# Patient Record
Sex: Female | Born: 1995 | Race: Black or African American | Hispanic: No | Marital: Single | Smoking: Never smoker
Health system: Southern US, Community
[De-identification: ages and names within clinical notes are randomized; demographics above are authoritative.]

## PROBLEM LIST (undated history)

## (undated) HISTORY — PX: APPENDECTOMY: SHX54

---
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
## 2014-02-13 ENCOUNTER — Other Ambulatory Visit (HOSPITAL_COMMUNITY)
Admission: RE | Admit: 2014-02-13 | Discharge: 2014-02-13 | Disposition: A | Discharge status: Home / Self Care | Payer: Medicaid Other | Source: Ambulatory Visit | Attending: Family Medicine | Admitting: Family Medicine

## 2014-02-13 ENCOUNTER — Emergency Department (INDEPENDENT_AMBULATORY_CARE_PROVIDER_SITE_OTHER)
Admission: EM | Admit: 2014-02-13 | Discharge: 2014-02-13 | Disposition: A | Discharge status: Home / Self Care | Payer: Medicaid Other | Source: Home / Self Care | Attending: Family Medicine | Admitting: Family Medicine

## 2014-02-13 ENCOUNTER — Encounter (HOSPITAL_COMMUNITY): Payer: Self-pay | Admitting: Emergency Medicine

## 2014-02-13 DIAGNOSIS — Z202 Contact with and (suspected) exposure to infections with a predominantly sexual mode of transmission: Secondary | ICD-10-CM

## 2014-02-13 DIAGNOSIS — Z711 Person with feared health complaint in whom no diagnosis is made: Secondary | ICD-10-CM

## 2014-02-13 DIAGNOSIS — Z113 Encounter for screening for infections with a predominantly sexual mode of transmission: Secondary | ICD-10-CM | POA: Insufficient documentation

## 2014-02-13 DIAGNOSIS — N760 Acute vaginitis: Secondary | ICD-10-CM | POA: Insufficient documentation

## 2014-02-13 MED ORDER — AZITHROMYCIN 250 MG PO TABS
1000.0000 mg | ORAL_TABLET | Freq: Once | ORAL | Status: AC
  Administered 2014-02-13: 1000 mg via ORAL

## 2014-02-13 MED ORDER — CEFTRIAXONE SODIUM 1 G IJ SOLR
250.0000 mg | Freq: Once | INTRAMUSCULAR | Status: AC
  Administered 2014-02-13: 250 mg via INTRAMUSCULAR

## 2014-02-13 MED ORDER — AZITHROMYCIN 250 MG PO TABS
ORAL_TABLET | ORAL | Status: AC
  Filled 2014-02-13: qty 4

## 2014-02-13 MED ORDER — CEFTRIAXONE SODIUM 250 MG IJ SOLR
INTRAMUSCULAR | Status: AC
  Filled 2014-02-13: qty 250

## 2014-02-13 MED ORDER — LIDOCAINE HCL (PF) 1 % IJ SOLN
INTRAMUSCULAR | Status: AC
  Filled 2014-02-13: qty 5

## 2014-02-13 NOTE — ED Provider Notes (Signed)
CSN: [id number]     Arrival date & time 02/13/14  1254 History   First MD Initiated Contact with Patient 02/13/14 1304     Chief Complaint  Patient presents with  . Exposure to STD   (Consider location/radiation/quality/duration/timing/severity/associated sxs/prior Treatment) Patient is a 18 y.o. female presenting with STD exposure. The history is provided by the patient.  Exposure to STD This is a new problem. The current episode started more than 1 week ago (tested neg on 9/14 for std, but after had sex with partner who now has sx, pt with no sx.). The problem has not changed since onset.Pertinent negatives include no abdominal pain.    History reviewed. No pertinent past medical history. Past Surgical History  Procedure Laterality Date  . Appendectomy     History reviewed. No pertinent family history. History  Substance Use Topics  . Smoking status: Never Smoker   . Smokeless tobacco: Not on file  . Alcohol Use: No   OB History   Grav Para Term Preterm Abortions TAB SAB Ect Mult Living                 Review of Systems  Constitutional: Negative.   Gastrointestinal: Negative.  Negative for abdominal pain.  Genitourinary: Negative.  Negative for dysuria, urgency, frequency, vaginal discharge and genital sores.    Allergies  Review of patient's allergies indicates no known allergies.  Home Medications   Prior to Admission medications   Medication Sig Start Date End Date Taking? Authorizing Provider  fluconazole (DIFLUCAN) 150 MG tablet Take 1 tablet (150 mg total) by mouth daily. Repeat dose in 3 days 07/05/13   Ozella Rocksavid J Merrell, MD  norgestimate-ethinyl estradiol (ORTHO-CYCLEN,SPRINTEC,PREVIFEM) 0.25-35 MG-MCG tablet Take 1 tablet by mouth daily.    Historical Provider, MD   BP 117/65  Pulse 66  Temp(Src) 98.7 F (37.1 C) (Oral)  Resp 16  SpO2 100%  LMP 02/08/2014 Physical Exam  Nursing note and vitals reviewed. Constitutional: She is oriented to person, place,  and time. She appears well-developed and well-nourished.  Neck: Normal range of motion. Neck supple.  Abdominal: Soft. Bowel sounds are normal. She exhibits no mass. There is no tenderness. There is no rebound and no guarding.  Lymphadenopathy:    She has no cervical adenopathy.  Neurological: She is alert and oriented to person, place, and time.  Skin: Skin is warm and dry.    ED Course  Procedures (including critical care time) Labs Review Labs Reviewed  URINE CYTOLOGY ANCILLARY ONLY    Imaging Review No results found.   MDM   1. Concern about STD in female without diagnosis        Linna HoffJames D Grey Schlauch, MD 02/13/14 1334

## 2014-02-13 NOTE — ED Notes (Signed)
Pt came in today because she said that she would like to be treated for an STD, she said that she had a negative STD test on 9/14 and then had sex the next day with someone who is symptomatic for an STD, pt say that she does not have any symptoms but would still like to be treated

## 2014-02-13 NOTE — Discharge Instructions (Signed)
We will call with positive test results and treat as indicatedWe will call with test results and treat as indicated

## 2014-02-14 LAB — URINE CYTOLOGY ANCILLARY ONLY
Chlamydia: NEGATIVE
Neisseria Gonorrhea: NEGATIVE
Trichomonas: NEGATIVE

## 2014-02-16 LAB — URINE CYTOLOGY ANCILLARY ONLY: Bacterial vaginitis: NEGATIVE

## 2014-02-21 ENCOUNTER — Emergency Department (HOSPITAL_COMMUNITY)
Admission: EM | Admit: 2014-02-21 | Discharge: 2014-02-22 | Disposition: A | Discharge status: Home / Self Care | Payer: Medicaid Other | Attending: Emergency Medicine | Admitting: Emergency Medicine

## 2014-02-21 ENCOUNTER — Encounter (HOSPITAL_COMMUNITY): Payer: Self-pay | Admitting: Emergency Medicine

## 2014-02-21 DIAGNOSIS — Z3202 Encounter for pregnancy test, result negative: Secondary | ICD-10-CM | POA: Insufficient documentation

## 2014-02-21 DIAGNOSIS — N898 Other specified noninflammatory disorders of vagina: Secondary | ICD-10-CM | POA: Diagnosis present

## 2014-02-21 DIAGNOSIS — Z79899 Other long term (current) drug therapy: Secondary | ICD-10-CM | POA: Insufficient documentation

## 2014-02-21 LAB — POC URINE PREG, ED: PREG TEST UR: NEGATIVE

## 2014-02-21 LAB — URINALYSIS, ROUTINE W REFLEX MICROSCOPIC
BILIRUBIN URINE: NEGATIVE
Glucose, UA: NEGATIVE mg/dL
HGB URINE DIPSTICK: NEGATIVE
Ketones, ur: 15 mg/dL — AB
Leukocytes, UA: NEGATIVE
NITRITE: NEGATIVE
PH: 6 (ref 5.0–8.0)
Protein, ur: NEGATIVE mg/dL
Specific Gravity, Urine: 1.034 — ABNORMAL HIGH (ref 1.005–1.030)
Urobilinogen, UA: 1 mg/dL (ref 0.0–1.0)

## 2014-02-21 LAB — WET PREP, GENITAL
TRICH WET PREP: NONE SEEN
Yeast Wet Prep HPF POC: NONE SEEN

## 2014-02-21 MED ORDER — HYDROCORTISONE 1 % EX CREA
TOPICAL_CREAM | Freq: Once | CUTANEOUS | Status: AC
  Administered 2014-02-22: 1 via TOPICAL
  Filled 2014-02-21: qty 28

## 2014-02-21 NOTE — ED Notes (Signed)
Pt sts she thinks she has a yeast infection. Pt reports chunky B[place]cker discharge intermittently since sept 14. Has been treated for it before but last week she received antibiotics for STD which cause her to get yeast infections. Pt also reports vaginal pain now.

## 2014-02-21 NOTE — ED Notes (Signed)
PA at bedside.

## 2014-02-21 NOTE — Discharge Instructions (Signed)
PLEASE DO NOT USE THE HYDROCORTISONE FOR MORE THAN A COUPLE OF DAYS YOU NEED TO SEE THE GYNECOLOGIST FOR FURTHER EVALUATION OF YOUR ONGOING SYMPTOMS PLEASE REFRAIN FROM SEX UNTIL YOUR CULTURE RESULTS COME BACK, YOU CAN F[facility] THE [facility]

## 2014-02-21 NOTE — ED Provider Notes (Signed)
CSN: [id number]     Arrival date & time 02/21/14  2017 History   First MD Initiated Contact with Patient 02/21/14 2116     Chief Complaint  Patient presents with  . Vaginal Discharge     (Consider location/radiation/quality/duration/timing/severity/associated sxs/prior Treatment) HPI  Patient to the ER for evaluation of vaginal discharge. Was recently treated for possible STD, then developed yeast infection and was treated with antifungals. She is now complaining about discharge again with foul odor. She is not having any pain. She brought her boyfriend to be tested at the same time for concern that they may be giving it back and forth. She otherwise feels fine;  History reviewed. No pertinent past medical history. Past Surgical History  Procedure Laterality Date  . Appendectomy     No family history on file. History  Substance Use Topics  . Smoking status: Never Smoker   . Smokeless tobacco: Not on file  . Alcohol Use: No   OB History   Grav Para Term Preterm Abortions TAB SAB Ect Mult Living                 Review of Systems   Review of Systems  Gen: no weight loss, fevers, chills, night sweats  Eyes: no occular draining, occular pain,  No visual changes  Nose: no epistaxis or rhinorrhea  Mouth: no dental pain, no sore throat  Neck: no neck pain  Lungs: No hemoptysis. No wheezing or coughing CV:  No palpitations, dependent edema or orthopnea. No chest pain Abd: no diarrhea. No nausea or vomiting, No abdominal pain  GU: no dysuria or gross hematuria + vaginal discharge and odor MSK:  No muscle weakness, No muscular pain Neuro: no headache, no focal neurologic deficits  Skin: no rash , no wounds Psyche: no complaints of depression or anxiety    Allergies  Review of patient's allergies indicates no known allergies.  Home Medications   Prior to Admission medications   Medication Sig Start Date End Date Taking? Authorizing Provider  norgestimate-ethinyl  estradiol (ORTHO-CYCLEN,SPRINTEC,PREVIFEM) 0.25-35 MG-MCG tablet Take 1 tablet by mouth daily.   Yes Historical Provider, MD   BP 104/72  Pulse 65  Temp(Src) 98.2 F (36.8 C) (Oral)  Resp 18  Ht 5\' 6"  (1.676 m)  Wt 130 lb (58.968 kg)  BMI 20.99 kg/m2  SpO2 100%  LMP 02/08/2014 Physical Exam  Nursing note and vitals reviewed. Constitutional: She appears well-developed and well-nourished. No distress.  HENT:  Head: Normocephalic and atraumatic.  Eyes: Pupils are equal, round, and reactive to light.  Neck: Normal range of motion. Neck supple.  Cardiovascular: Normal rate and regular rhythm.   Pulmonary/Chest: Effort normal.  Abdominal: Soft.  Genitourinary: Uterus normal. Cervix exhibits no motion tenderness, no discharge and no friability. Right adnexum displays no mass, no tenderness and no fullness. Left adnexum displays no mass, no tenderness and no fullness. No tenderness or bleeding around the vagina. No foreign body around the vagina. Vaginal discharge (whtie discharge noted in vaginal canal) found.  Neurological: She is alert.  Skin: Skin is warm and dry.    ED Course  Procedures (including critical care time) Labs Review Labs Reviewed  WET PREP, GENITAL - Abnormal; Notable for the following:    Clue Cells Wet Prep HPF POC FEW (*)    WBC, Wet Prep HPF POC MODERATE (*)    All other components within normal limits  URINALYSIS, ROUTINE W REFLEX MICROSCOPIC - Abnormal; Notable for the following:  Color, Urine AMBER (*)    Specific Gravity, Urine 1.034 (*)    Ketones, ur 15 (*)    All other components within normal limits  GC/CHLAMYDIA PROBE AMP  RPR  HIV ANTIBODY (ROUTINE TESTING)  POC URINE PREG, ED    Imaging Review No results found.   EKG Interpretation None      MDM   Final diagnoses:  Vaginal discharge    Gc, chlamydia, RPR and HIV pending. Pt elects not to be treated again at this time due to recently being treated. Recommended she see a  gynecologist for further eval/work-up.  18 y.o.Monic Prowell's evaluation in the Emergency Department is complete. It has been determined that no acute conditions requiring further emergency intervention are present at this time. The patient/guardian have been advised of the diagnosis and plan. We have discussed signs and symptoms that warrant return to the ED, such as changes or worsening in symptoms.  Vital signs are stable at discharge. Filed Vitals:   02/21/14 2358  BP: 104/72  Pulse: 65  Temp: 98.2 F (36.8 C)  Resp: 18    Patient/guardian has voiced understanding and agreed to follow-up with the PCP or specialist.     Dorthula Matas, PA-C 02/24/14 1418

## 2014-02-22 LAB — HIV ANTIBODY (ROUTINE TESTING W REFLEX): HIV 1&2 Ab, 4th Generation: NONREACTIVE

## 2014-02-22 LAB — RPR

## 2014-02-23 ENCOUNTER — Encounter: Payer: Medicaid Other | Admitting: Obstetrics & Gynecology

## 2014-02-23 LAB — GC/CHLAMYDIA PROBE AMP
CT Probe RNA: NEGATIVE
GC PROBE AMP APTIMA: NEGATIVE

## 2014-02-27 NOTE — ED Provider Notes (Signed)
  Medical screening examination/treatment/procedure(s) were performed by non-physician practitioner and as supervising physician I was immediately available for consultation/collaboration.    Gerhard Munchobert Tamatha Gadbois, MD 02/27/14 [phone]

## 2014-05-30 ENCOUNTER — Emergency Department (INDEPENDENT_AMBULATORY_CARE_PROVIDER_SITE_OTHER)
Admission: EM | Admit: 2014-05-30 | Discharge: 2014-05-30 | Disposition: A | Discharge status: Home / Self Care | Payer: Medicaid Other | Source: Home / Self Care | Attending: Emergency Medicine | Admitting: Emergency Medicine

## 2014-05-30 ENCOUNTER — Other Ambulatory Visit (HOSPITAL_COMMUNITY)
Admission: RE | Admit: 2014-05-30 | Discharge: 2014-05-30 | Disposition: A | Discharge status: Home / Self Care | Payer: Medicaid Other | Source: Ambulatory Visit | Attending: Emergency Medicine | Admitting: Emergency Medicine

## 2014-05-30 ENCOUNTER — Encounter (HOSPITAL_COMMUNITY): Payer: Self-pay | Admitting: *Deleted

## 2014-05-30 DIAGNOSIS — Z113 Encounter for screening for infections with a predominantly sexual mode of transmission: Secondary | ICD-10-CM | POA: Insufficient documentation

## 2014-05-30 DIAGNOSIS — N760 Acute vaginitis: Secondary | ICD-10-CM

## 2014-05-30 LAB — POCT URINALYSIS DIP (DEVICE)
Bilirubin Urine: NEGATIVE
Glucose, UA: NEGATIVE mg/dL
Hgb urine dipstick: NEGATIVE
Ketones, ur: NEGATIVE mg/dL
Leukocytes, UA: NEGATIVE
NITRITE: NEGATIVE
PROTEIN: NEGATIVE mg/dL
SPECIFIC GRAVITY, URINE: 1.025 (ref 1.005–1.030)
UROBILINOGEN UA: 0.2 mg/dL (ref 0.0–1.0)
pH: 7 (ref 5.0–8.0)

## 2014-05-30 LAB — POCT PREGNANCY, URINE: Preg Test, Ur: NEGATIVE

## 2014-05-30 MED ORDER — FLUCONAZOLE 150 MG PO TABS: 150.0000 mg | ORAL_TABLET | Freq: Once | ORAL | Status: DC

## 2014-05-30 NOTE — ED Provider Notes (Addendum)
CSN: [id number]     Arrival date & time 05/30/14  1331 History   First MD Initiated Contact with Patient 05/30/14 1346     Chief Complaint  Patient presents with  . Vaginal Discharge   (Consider location/radiation/quality/duration/timing/severity/associated sxs/prior Treatment) HPI        19 year old female presents for evaluation of vaginal discharge and vaginal itching. Symptoms have been worsening for the past 3 days. She denies any odor. She did have some dysuria and lower abdominal discomfort but that has resolved. She denies any risk for STDs. She has a history of frequent yeast infections, she thinks she has that again.   History reviewed. No pertinent past medical history. Past Surgical History  Procedure Laterality Date  . Appendectomy     History reviewed. No pertinent family history. History  Substance Use Topics  . Smoking status: Never Smoker   . Smokeless tobacco: Not on file  . Alcohol Use: No   OB History    No data available     Review of Systems  Constitutional: Negative for fever and chills.  Genitourinary: Positive for vaginal discharge. Negative for vaginal bleeding, genital sores, vaginal pain and pelvic pain.  All other systems reviewed and are negative.   Allergies  Doxycycline  Home Medications   Prior to Admission medications   Medication Sig Start Date End Date Taking? Authorizing Provider  fluconazole (DIFLUCAN) 150 MG tablet Take 1 tablet (150 mg total) by mouth once. Pick up the refill and and take second dose in 5 days if symptoms have not resolved 05/30/14   Graylon Good, PA-C  norgestimate-ethinyl estradiol (ORTHO-CYCLEN,SPRINTEC,PREVIFEM) 0.25-35 MG-MCG tablet Take 1 tablet by mouth daily.    Historical Provider, MD   BP 127/76 mmHg  Pulse 66  Temp(Src) 98.1 F (36.7 C) (Oral)  Resp 16  SpO2 100%  LMP 05/17/2014 Physical Exam  Constitutional: She is oriented to person, place, and time. Vital signs are normal. She appears  well-developed and well-nourished. No distress.  HENT:  Head: Normocephalic and atraumatic.  Pulmonary/Chest: Effort normal. No respiratory distress.  Abdominal: Soft. Bowel sounds are normal. She exhibits no distension and no mass. There is no tenderness. There is no rebound, no guarding and no CVA tenderness.  Genitourinary: There is no lesion on the right labia. There is no lesion on the left labia. Cervix exhibits no discharge and no friability. Right adnexum displays no mass, no tenderness and no fullness. Left adnexum displays no mass and no fullness. Vaginal discharge (Weinert, clumpy ) found.  Lymphadenopathy:       Right: No inguinal adenopathy present.       Left: No inguinal adenopathy present.  Neurological: She is alert and oriented to person, place, and time. She has normal strength. Coordination normal.  Skin: Skin is warm and dry. No rash noted. She is not diaphoretic.  Psychiatric: She has a normal mood and affect. Judgment normal.  Nursing note and vitals reviewed.   ED Course  Procedures (including critical care time) Labs Review Labs Reviewed  POCT PREGNANCY, URINE  POCT URINALYSIS DIP (DEVICE)  CERVICOVAGINAL ANCILLARY ONLY    Imaging Review No results found.   MDM   1. Vaginitis    Treat with Diflucan. Follow-up when necessary   Meds ordered this encounter  Medications  . fluconazole (DIFLUCAN) 150 MG tablet    Sig: Take 1 tablet (150 mg total) by mouth once. Pick up the refill and and take second dose in 5 days if symptoms have  not resolved    Dispense:  1 tablet    Refill:  1    Order Specific Question:  Supervising Provider    Answer:  Lorenz CoasterKELLER, DAVID C [[id number]]       Graylon GoodZachary H Fenna Semel, PA-C 05/30/14 1424      Positive for candida and chlamydia.  Sent in Rx for azithromycin.    Graylon GoodZachary H Guido Comp, PA-C 06/02/14 1031

## 2014-05-30 NOTE — ED Notes (Signed)
Pt  Reports     A  Vaginal  Discharge     X  3  Days       Pt      denys  Any  Sores  Or  Any         Bleeding  She  Ambulated  To  Room  With a  Steady  Fluid  Gait

## 2014-05-30 NOTE — Discharge Instructions (Signed)

## 2014-05-31 LAB — CERVICOVAGINAL ANCILLARY ONLY
Chlamydia: POSITIVE — AB
Neisseria Gonorrhea: NEGATIVE

## 2014-06-01 LAB — CERVICOVAGINAL ANCILLARY ONLY
WET PREP (BD AFFIRM): NEGATIVE
WET PREP (BD AFFIRM): NEGATIVE
WET PREP (BD AFFIRM): POSITIVE — AB

## 2014-06-02 ENCOUNTER — Telehealth (HOSPITAL_COMMUNITY): Payer: Self-pay | Admitting: *Deleted

## 2014-06-02 MED ORDER — AZITHROMYCIN 500 MG PO TABS: 1000.0000 mg | ORAL_TABLET | Freq: Once | ORAL | Status: DC

## 2014-06-02 NOTE — ED Notes (Signed)
GC neg., Chlamydia pos., Affirm: Candida pos., Gardnerella and Trich neg.  Pt. adequately treated with Diflucan. Discussed with Ian MalkinZach PA and he e-prescribed Zithromax to [facility] [address]. I called pt.  Pt. verified x 2 and given results.  Pt. told she was adequately treated with Diflucan for yeast infection and needs Zithromax for Chlamydia.  Pt. told where to pick up her Rx.  Pt. instructed to notify her partner, no sex for 1 week and to practice safe sex. Pt. [facility] by [facility].  [facility]. Vassie MoselleYork, Jonette Wassel M 06/02/2014

## 2015-10-18 ENCOUNTER — Ambulatory Visit (HOSPITAL_COMMUNITY)
Admission: EM | Admit: 2015-10-18 | Discharge: 2015-10-18 | Disposition: A | Discharge status: Home / Self Care | Payer: Medicaid Other | Attending: Emergency Medicine | Admitting: Emergency Medicine

## 2015-10-18 ENCOUNTER — Encounter (HOSPITAL_COMMUNITY): Payer: Self-pay | Admitting: *Deleted

## 2015-10-18 DIAGNOSIS — B373 Candidiasis of vulva and vagina: Secondary | ICD-10-CM

## 2015-10-18 DIAGNOSIS — B3731 Acute candidiasis of vulva and vagina: Secondary | ICD-10-CM

## 2015-10-18 DIAGNOSIS — L298 Other pruritus: Secondary | ICD-10-CM | POA: Diagnosis present

## 2015-10-18 LAB — POCT URINALYSIS DIP (DEVICE)
Bilirubin Urine: NEGATIVE
GLUCOSE, UA: NEGATIVE mg/dL
Hgb urine dipstick: NEGATIVE
Ketones, ur: NEGATIVE mg/dL
LEUKOCYTES UA: NEGATIVE
Nitrite: NEGATIVE
PROTEIN: 30 mg/dL — AB
SPECIFIC GRAVITY, URINE: 1.025 (ref 1.005–1.030)
UROBILINOGEN UA: 1 mg/dL (ref 0.0–1.0)
pH: 6.5 (ref 5.0–8.0)

## 2015-10-18 LAB — POCT PREGNANCY, URINE: PREG TEST UR: NEGATIVE

## 2015-10-18 MED ORDER — FLUCONAZOLE 200 MG PO TABS: 200.0000 mg | ORAL_TABLET | Freq: Every day | ORAL | Status: AC

## 2015-10-18 NOTE — ED Provider Notes (Signed)
CSN: [id number]     Arrival date & time 10/18/15  1358 History   First MD Initiated Contact with Patient 10/18/15 1514     Chief Complaint  Patient presents with  . Vaginal Itching   (Consider location/radiation/quality/duration/timing/severity/associated sxs/prior Treatment) HPI History obtained from patient:  Pt presents with the cc of:  Vaginal irritation, discharge Duration of symptoms: 1 week Treatment prior to arrival: none Context: used condoms, noticed irritation of vagina. Other symptoms include: Codner discharge without odor Pain score: 0 FAMILY HISTORY: no history of cancer    History reviewed. No pertinent past medical history. Past Surgical History  Procedure Laterality Date  . Appendectomy     History reviewed. No pertinent family history. Social History  Substance Use Topics  . Smoking status: Never Smoker   . Smokeless tobacco: None  . Alcohol Use: No   OB History    No data available     Review of Systems  Denies: HEADACHE, NAUSEA, ABDOMINAL PAIN, CHEST PAIN, CONGESTION, DYSURIA, SHORTNESS OF BREATH  Allergies  Doxycycline  Home Medications   Prior to Admission medications   Medication Sig Start Date End Date Taking? Authorizing Provider  azithromycin (ZITHROMAX) 500 MG tablet Take 2 tablets (1,000 mg total) by mouth once. 06/02/14   Graylon GoodZachary H Baker, PA-C  fluconazole (DIFLUCAN) 150 MG tablet Take 1 tablet (150 mg total) by mouth once. Pick up the refill and and take second dose in 5 days if symptoms have not resolved 05/30/14   Graylon GoodZachary H Baker, PA-C  fluconazole (DIFLUCAN) 200 MG tablet Take 1 tablet (200 mg total) by mouth daily. 10/18/15 10/25/15  Tharon AquasFrank C Liam Bossman, PA  norgestimate-ethinyl estradiol (ORTHO-CYCLEN,SPRINTEC,PREVIFEM) 0.25-35 MG-MCG tablet Take 1 tablet by mouth daily.    Historical Provider, MD   Meds Ordered and Administered this Visit  Medications - No data to display  BP 116/76 mmHg  Pulse 78  Temp(Src) 98 F (36.7 C) (Oral)  Resp  14  SpO2 100%  LMP 10/02/2015 No data found.   Physical Exam NURSES NOTES AND VITAL SIGNS REVIEWED. CONSTITUTIONAL: Well developed, well nourished, no acute distress HEENT: normocephalic, atraumatic EYES: Conjunctiva normal NECK:normal ROM, supple, no adenopathy PULMONARY:No respiratory distress, normal effort ABDOMINAL: Soft, ND, NT BS+, No CVAT MUSCULOSKELETAL: Normal ROM of all extremities,  SKIN: warm and dry without rash PSYCHIATRIC: Mood and affect, behavior are normal NURSES NOTES AND VITAL SIGNS REVIEWED. CONSTITUTIONAL: Well developed, well nourished, no acute distress HEENT: normocephalic, atraumatic, right and left TM's are normal EYES: Conjunctiva normal NECK:normal ROM, supple, no adenopathy PULMONARY:No respiratory distress, normal effort Exam is performed with patient's permission Female nursing staff present to chaperone.  Perineum: clean, dry without lesions, no groin or inguinal Lymphadenopathy; urethra . No caruncle or prolapse noted.  Vaginal canal: moderate amount thick Gao adherent discharge noted in canal with loss of rugae. Scant amount thin homogenous Sarracino-yellow discharge in fornix.  Cervix is pink and non-friable.    Bi-manual: no pain on palpation of cervix or adnexal structures, ovaries not enlarged.   ED Course  Procedures (including critical care time)  Labs Review Labs Reviewed  POCT URINALYSIS DIP (DEVICE) - Abnormal; Notable for the following:    Protein, ur 30 (*)    All other components within normal limits  POCT PREGNANCY, URINE  CERVICOVAGINAL ANCILLARY ONLY   Culture pending Imaging Review No results found.   Visual Acuity Review  Right Eye Distance:   Left Eye Distance:   Bilateral Distance:    Right Eye Near:  Left Eye Near:    Bilateral Near:         MDM   1. Vaginal yeast infection     Patient is reassured that there are no issues that require transfer to higher level of care at this time or additional  tests. Patient is advised to continue home symptomatic treatment. Patient is advised that if there are new or worsening symptoms to attend the emergency department, contact primary care provider, or return to UC. Instructions of care provided discharged home in stable condition.    THIS NOTE WAS GENERATED USING A VOICE RECOGNITION SOFTWARE PROGRAM. ALL REASONABLE EFFORTS  WERE MADE TO PROOFREAD THIS DOCUMENT FOR ACCURACY.  I have verbally reviewed the discharge instructions with the patient. A printed AVS was given to the patient.  All questions were answered prior to discharge.      Tharon Aquas, PA 10/18/15 1626

## 2015-10-18 NOTE — ED Notes (Signed)
Pt  Reports  Signs  Of vaginal  Irritation   Burning  -   With   Clumpy   Crance  Vaginal  Discharge  With   Symptoms  X  2  Weeks        Pt    Reports     Irritated      Sensation

## 2015-10-18 NOTE — Discharge Instructions (Signed)
Vaginitis °Vaginitis is an inflammation of the vagina. It can happen when the normal bacteria and yeast in the vagina grow too much. There are different types. Treatment will depend on the type you have. °HOME CARE °· Take all medicines as told by your doctor. °· Keep your vagina area clean and dry. Avoid soap. Rinse the area with water. °· Avoid washing and cleaning out the vagina (douching). °· Do not use tampons or have sex (intercourse) until your treatment is done. °· Wipe from front to back after going to the restroom. °· Wear cotton underwear. °· Avoid wearing underwear while you sleep until your vaginitis is gone. °· Avoid tight pants. Avoid underwear or nylons without a cotton panel. °· Take off wet clothing (such as a bathing suit) as soon as you can. °· Use mild, unscented products. Avoid fabric softeners and scented: °· Feminine sprays. °· Laundry detergents. °· Tampons. °· Soaps or bubble baths. °· Practice safe sex and use condoms. °GET HELP RIGHT AWAY IF:  °· You have belly (abdominal) pain. °· You have a fever or lasting symptoms for more than 2-3 days. °· You have a fever and your symptoms suddenly get worse. °MAKE SURE YOU:  °· Understand these instructions. °· Will watch this condition. °· Will get help right away if you are not doing well or get worse. °  °This information is not intended to replace advice given to you by your health care provider. Make sure you discuss any questions you have with your health care provider. °  °Document Released: 07/31/2008 Document Revised: 01/27/2012 Document Reviewed: 10/15/2011 °Elsevier Interactive Patient Education ©2016 [employer] ° °Monilial Vaginitis °Vaginitis in a soreness, swelling and redness (inflammation) of the vagina and vulva. Monilial vaginitis is not a sexually transmitted infection. °CAUSES  °Yeast vaginitis is caused by yeast (candida) that is normally found in your vagina. With a yeast infection, the candida has overgrown in number to a  point that upsets the chemical balance. °SYMPTOMS  °· [place], thick vaginal discharge. °· Swelling, itching, redness and irritation of the vagina and possibly the lips of the vagina (vulva). °· Burning or painful urination. °· Painful intercourse. °DIAGNOSIS  °Things that may contribute to monilial vaginitis are: °· Postmenopausal and virginal states. °· Pregnancy. °· Infections. °· Being tired, sick or stressed, especially if you had monilial vaginitis in the past. °· Diabetes. Good control will help lower the chance. °· Birth control pills. °· Tight fitting garments. °· Using bubble bath, feminine sprays, douches or deodorant tampons. °· Taking certain medications that kill germs (antibiotics). °· Sporadic recurrence can occur if you become ill. °TREATMENT  °Your caregiver will give you medication. °· There are several kinds of anti monilial vaginal creams and suppositories specific for monilial vaginitis. For recurrent yeast infections, use a suppository or cream in the vagina 2 times a week, or as directed. °· Anti-monilial or steroid cream for the itching or irritation of the vulva may also be used. Get your caregiver's permission. °· Painting the vagina with methylene blue solution may help if the monilial cream does not work. °· Eating yogurt may help prevent monilial vaginitis. °HOME CARE INSTRUCTIONS  °· Finish all medication as prescribed. °· Do not have sex until treatment is completed or after your caregiver tells you it is okay. °· Take warm sitz baths. °· Do not douche. °· Do not use tampons, especially scented ones. °· Wear cotton underwear. °· Avoid tight pants and panty hose. °· Tell your sexual partner   that you have a yeast infection. They should go to their caregiver if they have symptoms such as mild rash or itching. °· Your sexual partner should be treated as well if your infection is difficult to eliminate. °· Practice safer sex. Use condoms. °· Some vaginal medications cause latex condoms to  fail. Vaginal medications that harm condoms are: °¨ Cleocin cream. °¨ Butoconazole (Femstat®). °¨ Terconazole (Terazol®) vaginal suppository. °¨ Miconazole (Monistat®) (may be purchased over the counter). °SEEK MEDICAL CARE IF:  °· You have a temperature by mouth above 102° F (38.9° C). °· The infection is getting worse after 2 days of treatment. °· The infection is not getting better after 3 days of treatment. °· You develop blisters in or around your vagina. °· You develop vaginal bleeding, and it is not your menstrual period. °· You have pain when you urinate. °· You develop intestinal problems. °· You have pain with sexual intercourse. °  °This information is not intended to replace advice given to you by your health care provider. Make sure you discuss any questions you have with your health care provider. °  °Document Released: 02/11/2005 Document Revised: 07/27/2011 Document Reviewed: 11/05/2014 °Elsevier Interactive Patient Education ©2016 Elsevier Inc. ° °

## 2015-10-21 LAB — CERVICOVAGINAL ANCILLARY ONLY
Chlamydia: NEGATIVE
Neisseria Gonorrhea: NEGATIVE

## 2015-10-22 LAB — CERVICOVAGINAL ANCILLARY ONLY: Wet Prep (BD Affirm): NEGATIVE

## 2015-12-21 ENCOUNTER — Encounter (HOSPITAL_COMMUNITY): Payer: Self-pay | Admitting: *Deleted

## 2015-12-21 ENCOUNTER — Ambulatory Visit (HOSPITAL_COMMUNITY)
Admission: EM | Admit: 2015-12-21 | Discharge: 2015-12-21 | Disposition: A | Discharge status: Home / Self Care | Payer: Medicaid Other | Attending: Emergency Medicine | Admitting: Emergency Medicine

## 2015-12-21 DIAGNOSIS — B3731 Acute candidiasis of vulva and vagina: Secondary | ICD-10-CM

## 2015-12-21 DIAGNOSIS — B373 Candidiasis of vulva and vagina: Secondary | ICD-10-CM

## 2015-12-21 DIAGNOSIS — E049 Nontoxic goiter, unspecified: Secondary | ICD-10-CM

## 2015-12-21 LAB — POCT URINALYSIS DIP (DEVICE)
Bilirubin Urine: NEGATIVE
Glucose, UA: NEGATIVE mg/dL
Hgb urine dipstick: NEGATIVE
KETONES UR: NEGATIVE mg/dL
Leukocytes, UA: NEGATIVE
Nitrite: NEGATIVE
PROTEIN: NEGATIVE mg/dL
SPECIFIC GRAVITY, URINE: 1.02 (ref 1.005–1.030)
Urobilinogen, UA: 0.2 mg/dL (ref 0.0–1.0)
pH: 7 (ref 5.0–8.0)

## 2015-12-21 LAB — POCT PREGNANCY, URINE: Preg Test, Ur: NEGATIVE

## 2015-12-21 MED ORDER — MICONAZOLE NITRATE 1200 & 2 MG & % VA KIT
1.0000 | PACK | Freq: Once | VAGINAL | 0 refills | Status: AC
Start: 2015-12-21 — End: 2015-12-21

## 2015-12-21 MED ORDER — FLUCONAZOLE 150 MG PO TABS: ORAL_TABLET | ORAL | 0 refills | Status: AC

## 2015-12-21 NOTE — ED Provider Notes (Signed)
CSN: [id number]     Arrival date & time 12/21/15  1804 History   None    Chief Complaint  Patient presents with  . Oral Swelling   (Consider location/radiation/quality/duration/timing/severity/associated sxs/prior Treatment)   HPI   The patient is a 20 year old female presenting today with complaints of a swelling that has appeared on the right side of her anterior throat. Denies any difficulty swallowing or changes to her voice states that occasionally she feels as though "something is there and causes her to cough" denies any history personal or familial of thyroid issues or complaints. States last physical was year ago in her thyroid was 'fine". Denies tachycardia, nausea, sensitivity to heat or cold, or other symptoms. States does have occasional "dizziness". Primary complaint for patients night is vaginal discomfort with itching and a [place] thick vaginal discharge. Denies significant medical history or allergies to medications  History reviewed. No pertinent past medical history. Past Surgical History:  Procedure Laterality Date  . APPENDECTOMY     History reviewed. No pertinent family history. Social History  Substance Use Topics  . Smoking status: Never Smoker  . Smokeless tobacco: Never Used  . Alcohol use No   OB History    No data available     Review of Systems  Constitutional: Negative.  Negative for fatigue and fever.  HENT: Negative.  Negative for drooling, sore throat, trouble swallowing and voice change.   Eyes: Negative.  Negative for visual disturbance.  Respiratory: Negative.  Negative for cough, choking and shortness of breath.   Cardiovascular: Negative.  Negative for chest pain and leg swelling.  Gastrointestinal: Negative.   Endocrine: Negative.   Genitourinary: Positive for vaginal discharge. Negative for difficulty urinating and genital sores.  Musculoskeletal: Negative.  Negative for gait problem and neck stiffness.  Skin: Negative.  Negative for rash.   Allergic/Immunologic: Negative.   Neurological: Negative.  Negative for dizziness and headaches.  Hematological: Negative.   Psychiatric/Behavioral: Negative.     Allergies  Doxycycline  Home Medications   Prior to Admission medications   Medication Sig Start Date End Date Taking? Authorizing Provider  fluconazole (DIFLUCAN) 150 MG tablet Take one 150 mg tablet on first day, take second 125m tablet on day four. 12/21/15   CNehemiah Settle NP  miconazole (MONISTAT 1 COMBO PACK) kit Place 1 each vaginally once. 12/21/15 12/21/15  CNehemiah Settle NP  norgestimate-ethinyl estradiol (ORTHO-CYCLEN,SPRINTEC,PREVIFEM) 0.25-35 MG-MCG tablet Take 1 tablet by mouth daily.    Historical Provider, MD   Meds Ordered and Administered this Visit  Medications - No data to display  BP 119/77 (BP Location: Left Arm)   Pulse 74   Temp 97.6 F (36.4 C) (Oral)   Resp 16   LMP 11/29/2015   SpO2 99%  No data found.   Physical Exam  Constitutional: She appears well-developed and well-nourished. No distress.  Eyes: Pupils are equal, round, and reactive to light. Right eye exhibits no discharge. Left eye exhibits no discharge. No scleral icterus.  Neck: Normal range of motion. Neck supple. No tracheal deviation present. Thyromegaly present.  Thyroid is swollen and unable to palpate lobes or nodules due to enlargement. Moves freely with swallowing.  Cardiovascular: Normal rate, regular rhythm, normal heart sounds and intact distal pulses.  Exam reveals no gallop and no friction rub.   No murmur heard. Pulmonary/Chest: Effort normal and breath sounds normal. No respiratory distress. She has no wheezes. She has no rales. She exhibits no tenderness.  Genitourinary: Vagina normal. Pelvic  exam was performed with patient prone. No labial fusion. There is no tenderness, lesion or injury on the right labia. There is no tenderness, lesion or injury on the left labia.  Genitourinary Comments: There is a thick  [place] cottage cheeselike discharge present at the vaginal introitus and in the vaginal vault consistent with vaginal yeast infection. Moderate erythema noted in the folds between the labia majora and labia minora bilaterally. No breaks in skin or bleeding present.  Lymphadenopathy:    She has no cervical adenopathy.  Skin: Skin is warm and dry. She is not diaphoretic.  Nursing note and vitals reviewed.   Urgent Care Course   Clinical Course    Procedures (including critical care time)  Labs Review Labs Reviewed  POCT URINALYSIS DIP (DEVICE)  POCT PREGNANCY, URINE  CERVICOVAGINAL ANCILLARY ONLY   Results for orders placed or performed during the hospital encounter of 12/21/15  POCT urinalysis dip (device)  Result Value Ref Range   Glucose, UA NEGATIVE NEGATIVE mg/dL   Bilirubin Urine NEGATIVE NEGATIVE   Ketones, ur NEGATIVE NEGATIVE mg/dL   Specific Gravity, Urine 1.020 1.005 - 1.030   Hgb urine dipstick NEGATIVE NEGATIVE   pH 7.0 5.0 - 8.0   Protein, ur NEGATIVE NEGATIVE mg/dL   Urobilinogen, UA 0.2 0.0 - 1.0 mg/dL   Nitrite NEGATIVE NEGATIVE   Leukocytes, UA NEGATIVE NEGATIVE  Pregnancy, urine POC  Result Value Ref Range   Preg Test, Ur NEGATIVE NEGATIVE     MDM   1. Goiter   2. Vaginal yeast infection    Meds ordered this encounter  Medications  . miconazole (MONISTAT 1 COMBO PACK) kit    Sig: Place 1 each vaginally once.    Dispense:  1 each    Refill:  0    Order Specific Question:   Supervising Provider    Answer:   Melony Overly [id number]  . fluconazole (DIFLUCAN) 150 MG tablet    Sig: Take one 150 mg tablet on first day, take second 144m tablet on day four.    Dispense:  2 tablet    Refill:  0    Order Specific Question:   Supervising Provider    Answer:   HMelony Overly[[id number]    The patient cautioned to follow up with PCP or her choice asap regarding goiter and to go to Emergency Department should she develop any difficulty swallowing or breathing.  The usual and customary discharge instructions and warnings were given.  The patient verbalizes understanding and agrees to plan of care.       CNehemiah Settle NP 12/21/15 2051

## 2015-12-21 NOTE — Discharge Instructions (Signed)
It is important that you follow up with a primary care provider regarding the swelling over your thyroid.  Call Monday to schedule an appointment asap.  If you experience any difficulty swallowing or breathing call 911 or go to the nearest Emergency Department.

## 2015-12-21 NOTE — ED Triage Notes (Signed)
Pt  aslo  Reports  Some  Vaginal  Discomfort/ burning as  Well

## 2015-12-23 LAB — CERVICOVAGINAL ANCILLARY ONLY
Chlamydia: NEGATIVE
Neisseria Gonorrhea: NEGATIVE

## 2015-12-24 LAB — CERVICOVAGINAL ANCILLARY ONLY: Wet Prep (BD Affirm): NEGATIVE

## 2015-12-30 ENCOUNTER — Encounter (HOSPITAL_COMMUNITY): Payer: Self-pay | Admitting: Emergency Medicine

## 2015-12-30 ENCOUNTER — Emergency Department (HOSPITAL_COMMUNITY)
Admission: EM | Admit: 2015-12-30 | Discharge: 2015-12-30 | Disposition: A | Discharge status: Home / Self Care | Payer: Medicaid Other | Attending: Emergency Medicine | Admitting: Emergency Medicine

## 2015-12-30 ENCOUNTER — Emergency Department (HOSPITAL_COMMUNITY): Payer: Medicaid Other

## 2015-12-30 DIAGNOSIS — R609 Edema, unspecified: Secondary | ICD-10-CM

## 2015-12-30 DIAGNOSIS — J029 Acute pharyngitis, unspecified: Secondary | ICD-10-CM | POA: Insufficient documentation

## 2015-12-30 DIAGNOSIS — E048 Other specified nontoxic goiter: Secondary | ICD-10-CM | POA: Insufficient documentation

## 2015-12-30 DIAGNOSIS — M7989 Other specified soft tissue disorders: Secondary | ICD-10-CM | POA: Insufficient documentation

## 2015-12-30 DIAGNOSIS — E049 Nontoxic goiter, unspecified: Secondary | ICD-10-CM

## 2015-12-30 LAB — T4, FREE: Free T4: 1 ng/dL (ref 0.61–1.12)

## 2015-12-30 LAB — TSH: TSH: 1.018 u[IU]/mL (ref 0.350–4.500)

## 2015-12-30 NOTE — ED Triage Notes (Signed)
Pt complaint of goiter diagnosed two weeks ago. Complaint of sore throat with swallowing; denies SOB.

## 2015-12-30 NOTE — Discharge Instructions (Signed)
[facility] Physician [facility].   You have thyroid level test pending

## 2015-12-30 NOTE — ED Provider Notes (Signed)
[facility]-EMERGENCY DEPT Provider Note   CSN: [id number] Arrival date & time: 12/30/15  1655  By signing my name below, I, Jordan Hart, attest that this documentation has been prepared under the direction and in the presence of Jordan MaskerKaren Sofia, PA-C Electronically Signed: Soijett Hart, ED Scribe. 12/30/15. 7:20 PM.    History   Chief Complaint Chief Complaint  Patient presents with  . Goiter    HPI  Jordan Hart is a 20 y.o. female who presents to the Emergency Department complaining of a goiter onset 2 weeks. Pt notes that she was diagnosed with a goiter during a visit at an urgent care. She states that she is having associated symptoms of painful swallowing, chronic left leg swelling, and chronic left foot swelling. She states that she has not tried any medications for the relief for her symptoms. She denies trouble swallowing, fever, chills, hair loss, voice change, and any other symptoms.   The history is provided by the patient. No language interpreter was used.    History reviewed. No pertinent past medical history.  There are no active problems to display for this patient.   Past Surgical History:  Procedure Laterality Date  . APPENDECTOMY      OB History    No data available       Home Medications    Prior to Admission medications   Medication Sig Start Date End Date Taking? Authorizing Provider  fluconazole (DIFLUCAN) 150 MG tablet Take one 150 mg tablet on first day, take second 150mg  tablet on day four. 12/21/15   Servando Salinaatherine H Rossi, NP  norgestimate-ethinyl estradiol (ORTHO-CYCLEN,SPRINTEC,PREVIFEM) 0.25-35 MG-MCG tablet Take 1 tablet by mouth daily.    Historical Provider, MD    Family History No family history on file.  Social History Social History  Substance Use Topics  . Smoking status: Never Smoker  . Smokeless tobacco: Never Used  . Alcohol use No     Allergies   Doxycycline   Review of Systems Review of Systems  Constitutional: Negative  for chills and fever.  HENT: Positive for sore throat. Negative for trouble swallowing and voice change.        +Lower right anterior neck swelling.   Cardiovascular: Positive for leg swelling (chronic left leg).  Musculoskeletal: Positive for joint swelling (chronic left foot). Negative for arthralgias.  Skin: Negative for color change, rash and wound.     Physical Exam Updated Vital Signs BP 118/75 (BP Location: Right Arm)   Pulse 67   Temp 99.9 F (37.7 C) (Oral)   Resp 12   Ht 5\' 6"  (1.676 m)   Wt 168 lb (76.2 kg)   LMP 12/02/2015   SpO2 100%   BMI 27.12 kg/m   Physical Exam  Neck: Normal range of motion. Thyroid mass and thyromegaly present.  Lower right anterior neck swelling. Diffuse enlargement of palpable mass.      ED Treatments / Results  DIAGNOSTIC STUDIES: Oxygen Saturation is 100% on RA, nl by my interpretation.    COORDINATION OF CARE: 7:20 PM Discussed treatment plan with pt at bedside which includes TSH, T3, T4, and US thyroid, and pt agreed to plan.   Labs (all labs ordered are listed, but only abnormal results are displayed) Labs Reviewed - No data to display  EKG  EKG Interpretation None       Radiology No results found.  Procedures Procedures (including critical care time)  Medications Ordered in ED Medications - No data to display   Initial Impression /  Assessment and Plan / ED Course  I have reviewed the triage vital signs and the nursing notes.  Pertinent labs & imaging results that were available during my care of the patient were reviewed by me and considered in my medical decision making (see chart for details).  Clinical Course      Final Clinical Impressions(s) / ED Diagnoses   Final diagnoses:  Swelling  Thyroid goiter    New Prescriptions New Prescriptions   No medications on file    Pt advised to [facility] or with your primary MD for recheck in 2-3 days.  You have labs that are pending     Elson AreasLeslie K Sofia, PA-C 12/30/15 2122    Lonia SkinnerLeslie K Chenango BridgeSofia, PA-C 12/30/15 2129    Nira ConnPedro Eduardo Cardama, MD 01/01/16 [phone]

## 2016-01-01 LAB — T3: T3 TOTAL: 120 ng/dL (ref 71–180)

## 2017-06-08 IMAGING — US US THYROID
1 series · 14 of 25 positions shown · non-contrast
Comparison: None.

CLINICAL DATA: Subacute onset of thyroid goiter for 2 weeks.
Initial encounter.

EXAM:
THYROID ULTRASOUND
TECHNIQUE: Ultrasound examination of the thyroid gland and adjacent soft
tissues was performed.

[Series 1: us thyroid · 0.09mm/px · 14 of 36 slices shown]
[im 1/36]
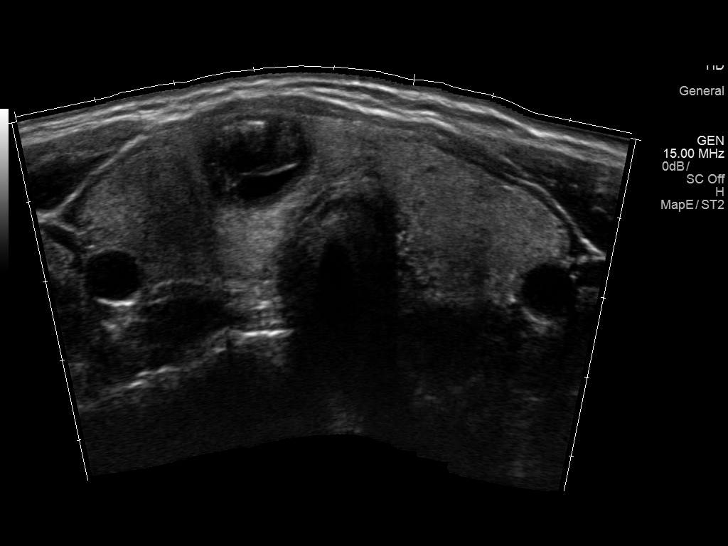
[im 3/36]
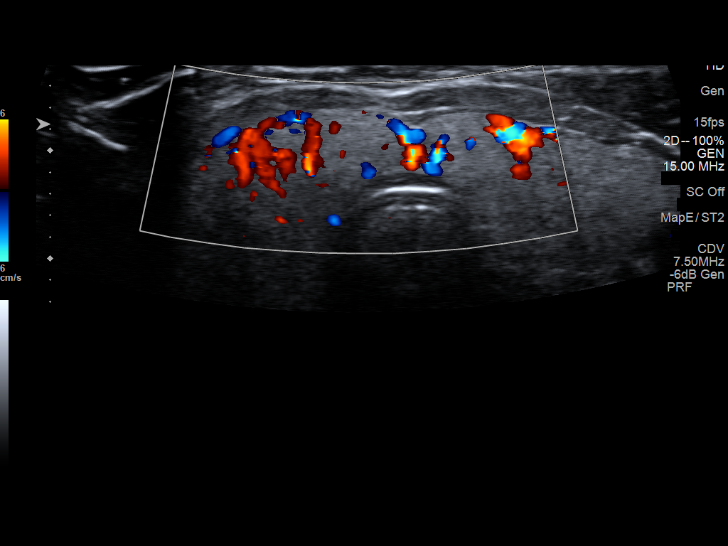
[im 6/36]
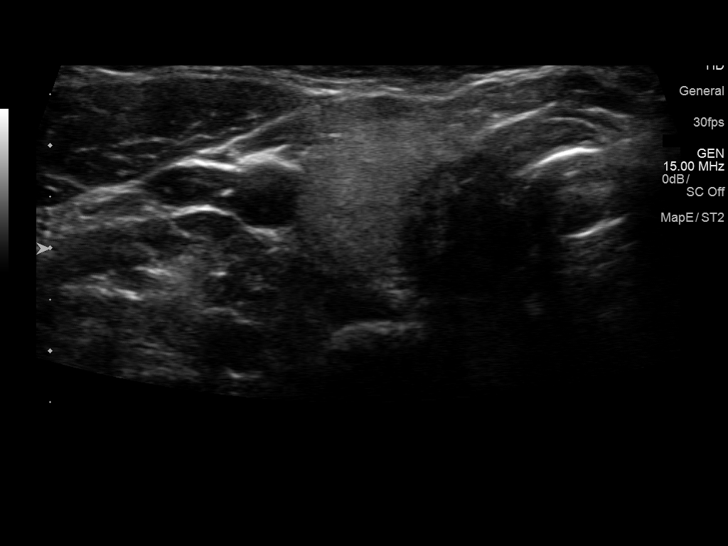
[im 9/36]
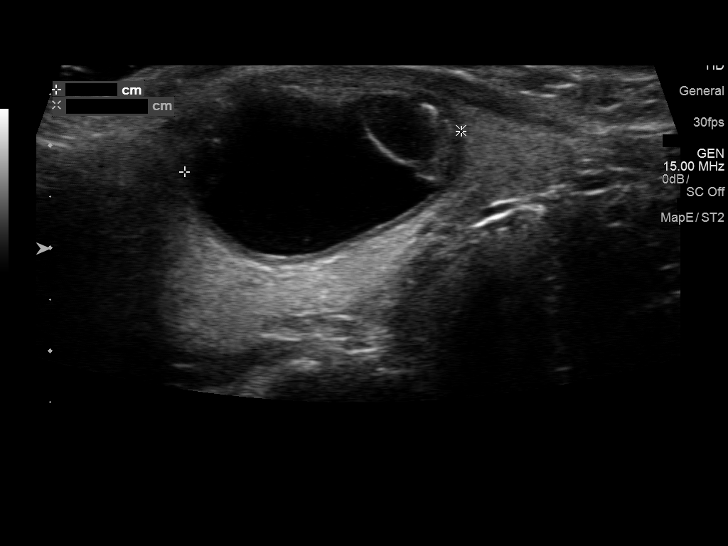
[im 12/36]
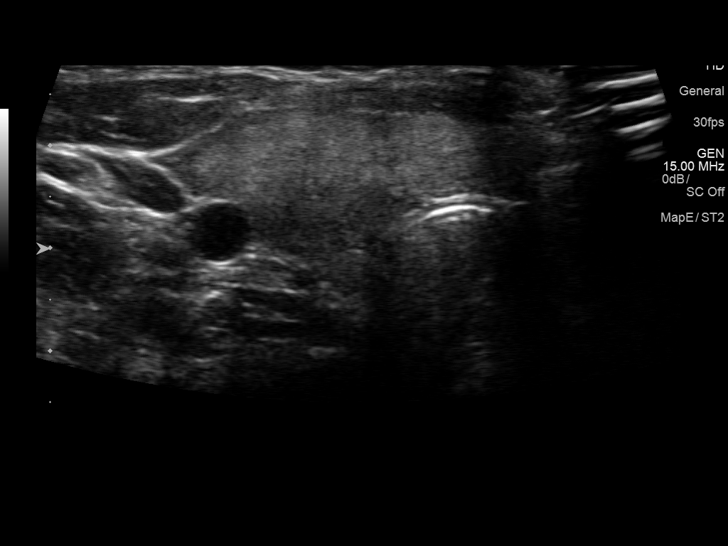
[im 14/36]
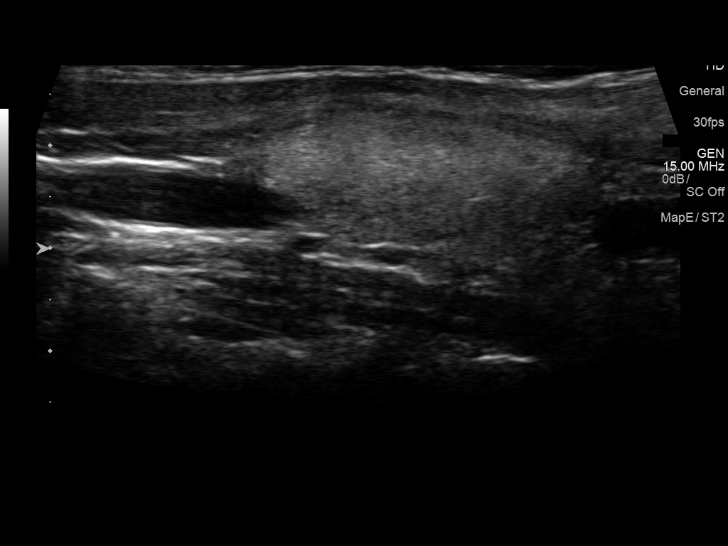
[im 17/36]
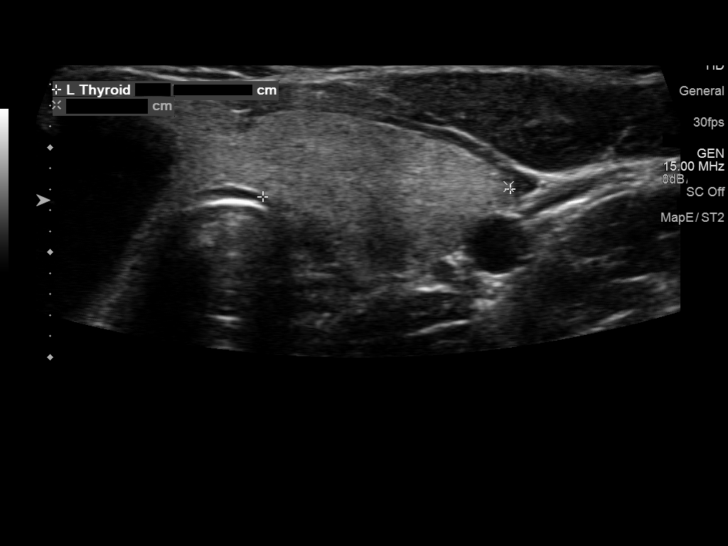
[im 19/36]
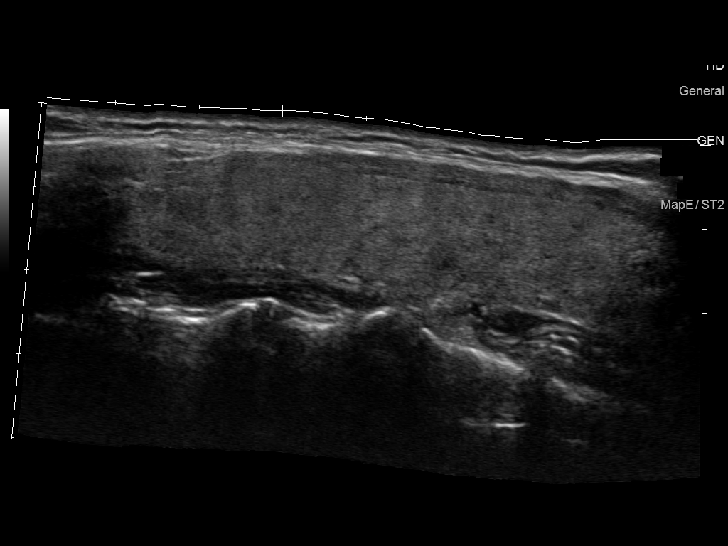
[im 22/36]
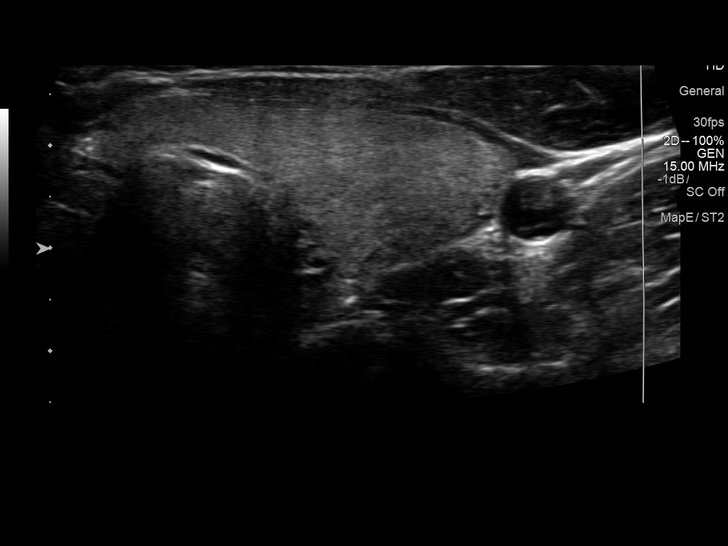
[im 24/36]
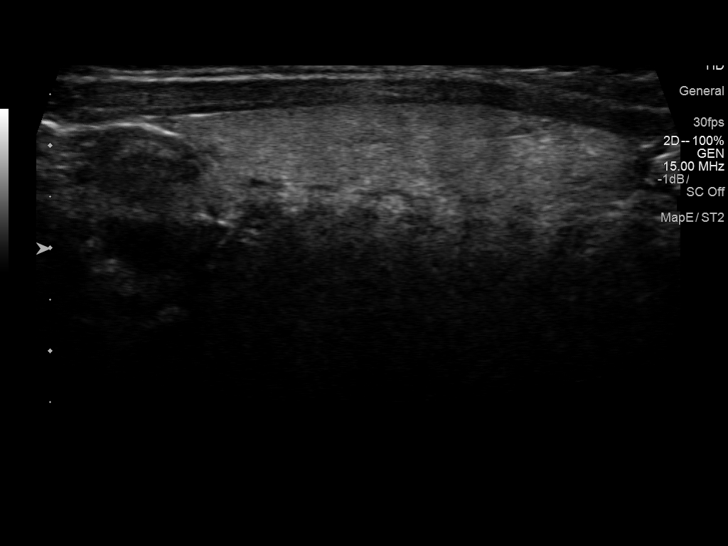
[im 27/36]
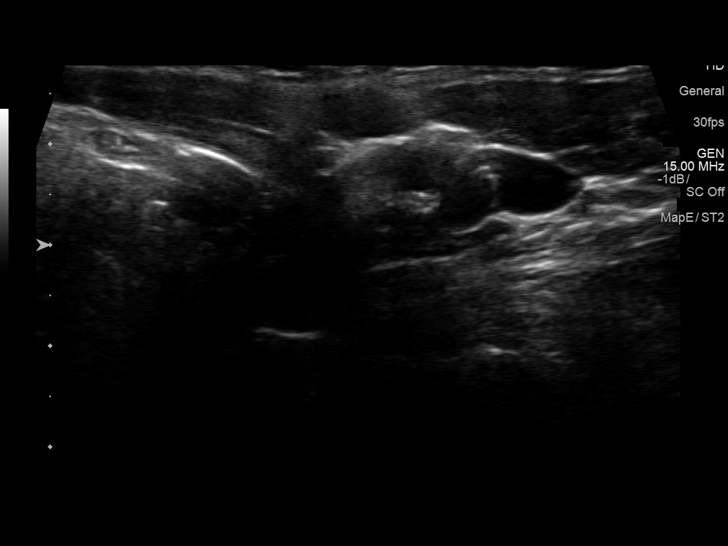
[im 30/36]
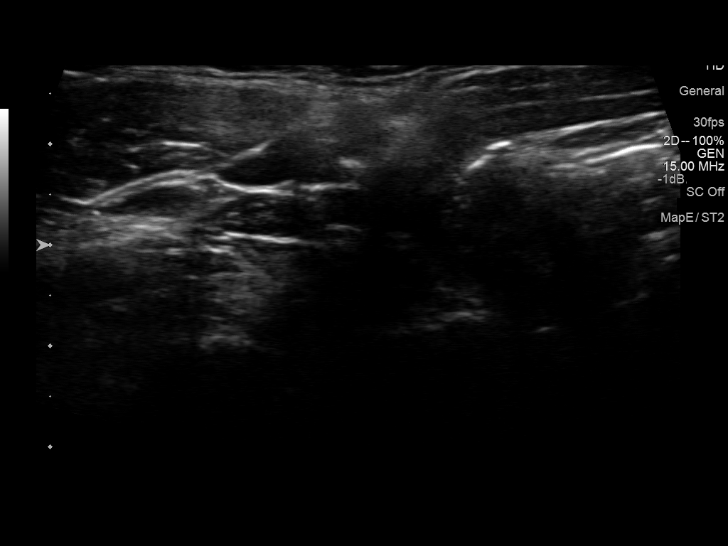
[im 33/36]
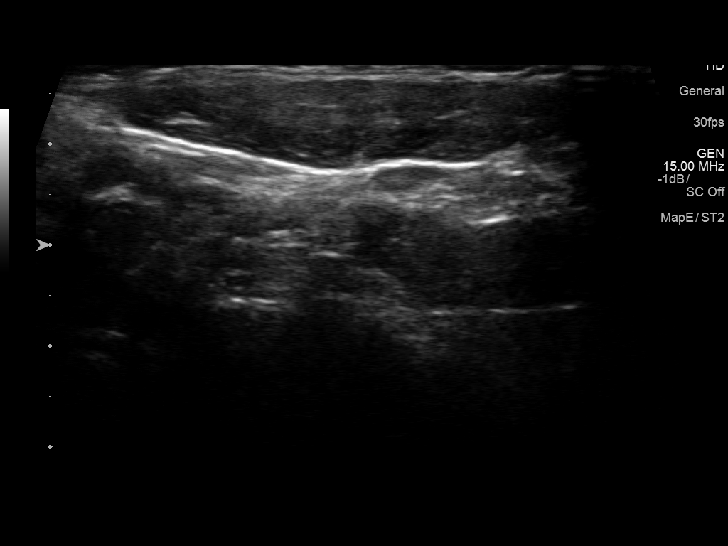
[im 36/36]
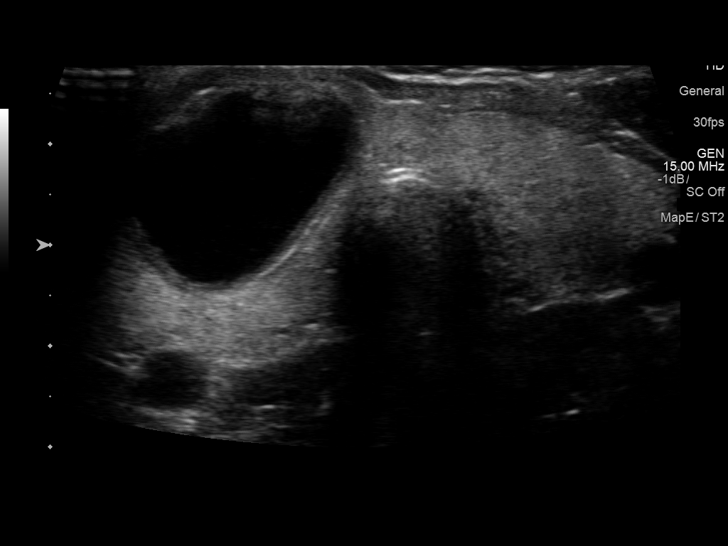

[14 of 25 positions shown; findings below may reference images not displayed]

FINDINGS: Right thyroid lobe

Measurements: 5.2 x 2.7 x 3.2 cm. A mildly complex cyst is noted at
the midportion of the right thyroid lobe, measuring 2.7 x 1.6 x
cm. No associated blood flow is seen.

Left thyroid lobe

Measurements: 6.6 x 1.5 x 2.4 cm.  No nodules visualized.

Isthmus

Thickness: 0.5 cm.  No nodules visualized.

Lymphadenopathy

None visualized.
IMPRESSION: 1. Mildly enlarged thyroid gland, suggesting mild thyroid goiter.
2. Mildly complex cyst noted at the midportion of the right thyroid
lobe, measuring 2.7 x 1.6 x 2.5 cm. No associated blood flow seen.
This is most likely benign, given its appearance.
